# Patient Record
Sex: Male | Born: 1990 | Race: Black or African American | Hispanic: No | Marital: Single | Smoking: Never smoker
Health system: Southern US, Community
[De-identification: ages and names within clinical notes are randomized; demographics above are authoritative.]

## PROBLEM LIST (undated history)

## (undated) DIAGNOSIS — I10 Essential (primary) hypertension: Secondary | ICD-10-CM

---

## 2016-12-09 ENCOUNTER — Encounter (HOSPITAL_COMMUNITY): Payer: Self-pay

## 2016-12-09 ENCOUNTER — Emergency Department (HOSPITAL_COMMUNITY): Payer: Self-pay

## 2016-12-09 ENCOUNTER — Emergency Department (HOSPITAL_COMMUNITY)
Admission: EM | Admit: 2016-12-09 | Discharge: 2016-12-09 | Disposition: A | Discharge status: Home / Self Care | Payer: Self-pay | Attending: Emergency Medicine | Admitting: Emergency Medicine

## 2016-12-09 DIAGNOSIS — R61 Generalized hyperhidrosis: Secondary | ICD-10-CM | POA: Insufficient documentation

## 2016-12-09 DIAGNOSIS — R112 Nausea with vomiting, unspecified: Secondary | ICD-10-CM | POA: Insufficient documentation

## 2016-12-09 DIAGNOSIS — R51 Headache: Secondary | ICD-10-CM | POA: Insufficient documentation

## 2016-12-09 DIAGNOSIS — R519 Headache, unspecified: Secondary | ICD-10-CM

## 2016-12-09 HISTORY — DX: Essential (primary) hypertension: I10

## 2016-12-09 LAB — CBC WITH DIFFERENTIAL/PLATELET
Basophils Absolute: 0 10*3/uL (ref 0.0–0.1)
Basophils Relative: 0 %
EOS ABS: 0.4 10*3/uL (ref 0.0–0.7)
Eosinophils Relative: 3 %
HEMATOCRIT: 42.2 % (ref 39.0–52.0)
HEMOGLOBIN: 13.8 g/dL (ref 13.0–17.0)
LYMPHS ABS: 4.9 10*3/uL — AB (ref 0.7–4.0)
Lymphocytes Relative: 32 %
MCH: 27.1 pg (ref 26.0–34.0)
MCHC: 32.7 g/dL (ref 30.0–36.0)
MCV: 82.7 fL (ref 78.0–100.0)
MONOS PCT: 7 %
Monocytes Absolute: 1 10*3/uL (ref 0.1–1.0)
NEUTROS ABS: 9.3 10*3/uL — AB (ref 1.7–7.7)
NEUTROS PCT: 58 %
Platelets: 313 10*3/uL (ref 150–400)
RBC: 5.1 MIL/uL (ref 4.22–5.81)
RDW: 15.2 % (ref 11.5–15.5)
WBC: 15.7 10*3/uL — AB (ref 4.0–10.5)

## 2016-12-09 LAB — BASIC METABOLIC PANEL
ANION GAP: 8 (ref 5–15)
BUN: 6 mg/dL (ref 6–20)
CALCIUM: 9 mg/dL (ref 8.9–10.3)
CO2: 29 mmol/L (ref 22–32)
Chloride: 104 mmol/L (ref 101–111)
Creatinine, Ser: 0.65 mg/dL (ref 0.61–1.24)
GLUCOSE: 104 mg/dL — AB (ref 65–99)
POTASSIUM: 3.7 mmol/L (ref 3.5–5.1)
Sodium: 141 mmol/L (ref 135–145)

## 2016-12-09 LAB — I-STAT TROPONIN, ED: TROPONIN I, POC: 0 ng/mL (ref 0.00–0.08)

## 2016-12-09 MED ORDER — METOCLOPRAMIDE HCL 5 MG/ML IJ SOLN
10.0000 mg | Freq: Once | INTRAMUSCULAR | Status: AC
  Administered 2016-12-09: 10 mg via INTRAVENOUS
  Filled 2016-12-09: qty 2

## 2016-12-09 MED ORDER — ACETAMINOPHEN 325 MG PO TABS
650.0000 mg | ORAL_TABLET | Freq: Once | ORAL | Status: AC
  Administered 2016-12-09: 650 mg via ORAL
  Filled 2016-12-09: qty 2

## 2016-12-09 MED ORDER — DIPHENHYDRAMINE HCL 50 MG/ML IJ SOLN
25.0000 mg | Freq: Once | INTRAMUSCULAR | Status: AC
  Administered 2016-12-09: 25 mg via INTRAVENOUS
  Filled 2016-12-09: qty 1

## 2016-12-09 MED ORDER — AMLODIPINE BESYLATE 5 MG PO TABS: 5.0000 mg | ORAL_TABLET | Freq: Every day | ORAL | 0 refills | Status: AC

## 2016-12-09 MED ORDER — METOCLOPRAMIDE HCL 10 MG PO TABS: 10.0000 mg | ORAL_TABLET | Freq: Four times a day (QID) | ORAL | 0 refills | Status: DC | PRN

## 2016-12-09 NOTE — ED Notes (Signed)
Pt also complains of a headache

## 2016-12-09 NOTE — ED Triage Notes (Signed)
Pt complains of hypertension and severe sweating Pt took a norvasc yesterday, his old medicine

## 2016-12-09 NOTE — ED Provider Notes (Addendum)
[facility]-EMERGENCY DEPT Provider Note   CSN: [id number] Arrival date & time: 12/09/16  0654     History   Chief Complaint Chief Complaint  Patient presents with  . Hypertension  . Excessive Sweating    HPI Ronald Marks is a 26 y.o. male.  HPI Complains of throbbing bitemporal headache which started yesterday, gradual onset accompanied by vomiting 5 times this morning. Chills. He denies any neck pain. Denies sore throat denies neck stiffness. No other associated symptoms. Treated himself with Tylenol last dose 5 AM today. He reports getting headaches approximately every other day for the past 6 months. Nothing makes symptoms better or worse. No chest pain. No fever. Also treated himself with Norvasc this morning. Past Medical History:  Diagnosis Date  . Hypertension     There are no active problems to display for this patient.   History reviewed. No pertinent surgical history.     Home Medications    Prior to Admission medications   Not on File    Family History History reviewed. No pertinent family history.  Social History Social History  Substance Use Topics  . Smoking status: Never Smoker  . Smokeless tobacco: Never Used  . Alcohol use No   Positive marijuana use no tobacco no alcohol no other illicit drug use  Allergies   Patient has no allergy information on record.   Review of Systems Review of Systems  Constitutional: Positive for diaphoresis.  HENT: Negative.   Respiratory: Negative.   Cardiovascular: Positive for chest pain.       Syncope  Gastrointestinal: Positive for nausea and vomiting.  Musculoskeletal: Negative.   Skin: Negative.   Allergic/Immunologic: Negative.   Neurological: Positive for headaches.  Psychiatric/Behavioral: Negative.   All other systems reviewed and are negative.    Physical Exam Updated Vital Signs BP (!) 145/96 (BP Location: Left Arm)   Pulse 77   Temp 97.9 F (36.6 C) (Oral)   SpO2 99%   Physical Exam   Constitutional: He is oriented to person, place, and time. He appears well-developed and well-nourished.  HENT:  Head: Normocephalic and atraumatic.  Eyes: Conjunctivae are normal. Pupils are equal, round, and reactive to light.  Optic discs sharp  Neck: Neck supple. No tracheal deviation present. No thyromegaly present.  No signs of meningitis  Cardiovascular: Normal rate and regular rhythm.   No murmur heard. Pulmonary/Chest: Effort normal and breath sounds normal.  Abdominal: Soft. Bowel sounds are normal. He exhibits no distension. There is no tenderness.  Obese  Musculoskeletal: Normal range of motion. He exhibits no edema or tenderness.  Lymphadenopathy:    He has no cervical adenopathy.  Neurological: He is alert and oriented to person, place, and time. Coordination normal.  Gait normal motor strength 5 over 5 overall pronator drift normal. Not lightheaded on standing  Skin: Skin is warm. No rash noted. He is diaphoretic.  Diaphoretic  Psychiatric: He has a normal mood and affect. Thought content normal.  Nursing note and vitals reviewed.    ED Treatments / Results  Labs (all labs ordered are listed, but only abnormal results are displayed) Labs Reviewed - No data to display  EKG  EKG Interpretation None       Radiology No results found.  Procedures Procedures (including critical care time)  Medications Ordered in ED Medications - No data to display   Initial Impression / Assessment and Plan / ED Course  I have reviewed the triage vital signs and the nursing notes.  Pertinent  labs & imaging results that were available during my care of the patient were reviewed by me and considered in my medical decision making (see chart for details).     8:30 AM feels improved after treatment with intravenous Reglan and Benadryl. He is resting comfortably. Alert Glasgow Coma Score 15. Skin no longer diaphoretic and now warm and dry ED ECG REPORT   Date: 12/09/2016   Rate:   Rhythm: normal sinus rhythm  QRS Axis: normal  Intervals: normal  ST/T Wave abnormalities: nonspecific T wave changes  Conduction Disutrbances:Rightward IV CD  Narrative Interpretation:   Old EKG Reviewed: none available  Results for orders placed or performed during the hospital encounter of 12/09/16  CBC with Differential/Platelet  Result Value Ref Range   WBC 15.7 (H) 4.0 - 10.5 K/uL   RBC 5.10 4.22 - 5.81 MIL/uL   Hemoglobin 13.8 13.0 - 17.0 g/dL   HCT 40.9 81.1 - 91.4 %   MCV 82.7 78.0 - 100.0 fL   MCH 27.1 26.0 - 34.0 pg   MCHC 32.7 30.0 - 36.0 g/dL   RDW 78.2 95.6 - 21.3 %   Platelets 313 150 - 400 K/uL   Neutrophils Relative % 58 %   Neutro Abs 9.3 (H) 1.7 - 7.7 K/uL   Lymphocytes Relative 32 %   Lymphs Abs 4.9 (H) 0.7 - 4.0 K/uL   Monocytes Relative 7 %   Monocytes Absolute 1.0 0.1 - 1.0 K/uL   Eosinophils Relative 3 %   Eosinophils Absolute 0.4 0.0 - 0.7 K/uL   Basophils Relative 0 %   Basophils Absolute 0.0 0.0 - 0.1 K/uL  Basic metabolic panel  Result Value Ref Range   Sodium 141 135 - 145 mmol/L   Potassium 3.7 3.5 - 5.1 mmol/L   Chloride 104 101 - 111 mmol/L   CO2 29 22 - 32 mmol/L   Glucose, Bld 104 (H) 65 - 99 mg/dL   BUN 6 6 - 20 mg/dL   Creatinine, Ser 0.86 0.61 - 1.24 mg/dL   Calcium 9.0 8.9 - 57.8 mg/dL   GFR calc non Af Amer >60 >60 mL/min   GFR calc Af Amer >60 >60 mL/min   Anion gap 8 5 - 15  I-stat troponin, ED  Result Value Ref Range   Troponin i, poc 0.00 0.00 - 0.08 ng/mL   Comment 3           Ct Head Wo Contrast  Result Date: 12/09/2016 CLINICAL DATA:  Severe headaches for 2 days EXAM: CT HEAD WITHOUT CONTRAST TECHNIQUE: Contiguous axial images were obtained from the base of the skull through the vertex without intravenous contrast. COMPARISON:  None. FINDINGS: Brain: No evidence of acute infarction, hemorrhage, hydrocephalus, extra-axial collection or mass lesion/mass effect. Vascular: No hyperdense vessel or unexpected  calcification. Skull: No osseous abnormality. Sinuses/Orbits: Visualized paranasal sinuses are clear. Visualized mastoid sinuses are clear. Visualized orbits demonstrate no focal abnormality. Other: None IMPRESSION: No acute intracranial pathology. Electronically Signed   By: Elige Ko   On: 12/09/2016 08:17   Results for orders placed or performed during the hospital encounter of 12/09/16  CBC with Differential/Platelet  Result Value Ref Range   WBC 15.7 (H) 4.0 - 10.5 K/uL   RBC 5.10 4.22 - 5.81 MIL/uL   Hemoglobin 13.8 13.0 - 17.0 g/dL   HCT 46.9 62.9 - 52.8 %   MCV 82.7 78.0 - 100.0 fL   MCH 27.1 26.0 - 34.0 pg   MCHC 32.7 30.0 -  36.0 g/dL   RDW 16.1 09.6 - 04.5 %   Platelets 313 150 - 400 K/uL   Neutrophils Relative % 58 %   Neutro Abs 9.3 (H) 1.7 - 7.7 K/uL   Lymphocytes Relative 32 %   Lymphs Abs 4.9 (H) 0.7 - 4.0 K/uL   Monocytes Relative 7 %   Monocytes Absolute 1.0 0.1 - 1.0 K/uL   Eosinophils Relative 3 %   Eosinophils Absolute 0.4 0.0 - 0.7 K/uL   Basophils Relative 0 %   Basophils Absolute 0.0 0.0 - 0.1 K/uL  Basic metabolic panel  Result Value Ref Range   Sodium 141 135 - 145 mmol/L   Potassium 3.7 3.5 - 5.1 mmol/L   Chloride 104 101 - 111 mmol/L   CO2 29 22 - 32 mmol/L   Glucose, Bld 104 (H) 65 - 99 mg/dL   BUN 6 6 - 20 mg/dL   Creatinine, Ser 4.09 0.61 - 1.24 mg/dL   Calcium 9.0 8.9 - 81.1 mg/dL   GFR calc non Af Amer >60 >60 mL/min   GFR calc Af Amer >60 >60 mL/min   Anion gap 8 5 - 15  I-stat troponin, ED  Result Value Ref Range   Troponin i, poc 0.00 0.00 - 0.08 ng/mL   Comment 3           Ct Head Wo Contrast  Result Date: 12/09/2016 CLINICAL DATA:  Severe headaches for 2 days EXAM: CT HEAD WITHOUT CONTRAST TECHNIQUE: Contiguous axial images were obtained from the base of the skull through the vertex without intravenous contrast. COMPARISON:  None. FINDINGS: Brain: No evidence of acute infarction, hemorrhage, hydrocephalus, extra-axial collection or  mass lesion/mass effect. Vascular: No hyperdense vessel or unexpected calcification. Skull: No osseous abnormality. Sinuses/Orbits: Visualized paranasal sinuses are clear. Visualized mastoid sinuses are clear. Visualized orbits demonstrate no focal abnormality. Other: None IMPRESSION: No acute intracranial pathology. Electronically Signed   By: Elige Ko   On: 12/09/2016 08:17    10 AM headache is much improved, almost gone. He is requesting Tylenol for mild left-sided temporal headache presently. Nausea has resolved. He is alert and ambulatory without difficulty. Skin warm and dry without rash. He feels ready to go home.  Strongly doubt meningitis. No fever. No neck pain no neck stiffness. He gets chronic headaches. Strongly doubt anginal: This young male. Plan prescriptions Norvasc, Reglan as needed for nausea and headache. Blood pressure recheck 3 weeks. I have personally reviewed the EKG tracing and agree with the computerized printout as noted. Patient has chronic headaches Final Clinical Impressions(s) / ED Diagnoses  Diagnosis #1 bitemporal headache #2 nausea and vomiting 3 elevated blood pressure Final diagnoses:  None    New Prescriptions New Prescriptions   No medications on file     Doug Sou, MD 12/09/16 1010    Doug Sou, MD 12/09/16 1536

## 2016-12-09 NOTE — Discharge Instructions (Signed)
Take the medication prescribed as needed for nausea. Take your blood pressure medication(amlodipine or Norvasc) daily as directed. Call any of the numbers on these instructions to get a primary care physician. Your blood pressure should be rechecked within the next 3 weeks

## 2017-08-13 ENCOUNTER — Encounter (HOSPITAL_COMMUNITY): Payer: Self-pay

## 2017-08-13 ENCOUNTER — Other Ambulatory Visit: Payer: Self-pay

## 2017-08-13 ENCOUNTER — Emergency Department (HOSPITAL_COMMUNITY)
Admission: EM | Admit: 2017-08-13 | Discharge: 2017-08-13 | Disposition: A | Discharge status: Home / Self Care | Payer: Self-pay | Attending: Emergency Medicine | Admitting: Emergency Medicine

## 2017-08-13 DIAGNOSIS — Z79899 Other long term (current) drug therapy: Secondary | ICD-10-CM | POA: Insufficient documentation

## 2017-08-13 DIAGNOSIS — I10 Essential (primary) hypertension: Secondary | ICD-10-CM | POA: Insufficient documentation

## 2017-08-13 DIAGNOSIS — R55 Syncope and collapse: Secondary | ICD-10-CM | POA: Insufficient documentation

## 2017-08-13 NOTE — ED Provider Notes (Signed)
[facility] EMERGENCY DEPARTMENT Provider Note   CSN: [id number] Arrival date & time: 08/13/17  1210     History   Chief Complaint Chief Complaint  Patient presents with  . Loss of Consciousness  . URI    HPI Ronald Marks is a 27 y.o. male.  The history is provided by the patient and medical records. No language interpreter was used.  URI   Associated symptoms include diarrhea, nausea and congestion. Pertinent negatives include no abdominal pain, no vomiting and no headaches.   Ronald Marks is a 27 y.o. male  with a PMH of HTN who presents to the Emergency Department complaining of near-syncopal episode just prior to arrival. Patient states that he has been sick for the last 3-4 days.  Endorses subjective fever and chills, congestion, nausea and diarrhea.  Children at home with similar symptoms and have been on antibiotics.  He reports that he felt much better today and has had no episodes of nausea or diarrhea.  He went to the grocery store and started walking around when he began to feel lightheaded, therefore sat down.  Attendant at the store brought him a bottle of water and he felt better.  He never lost consciousness.  EMS was called and transported him to the emergency department.  He did receive about 300 cc of IV hydration in route.  He now reports that he feels much better and has no complaints.  He never had any chest pain or difficulty breathing.  He did not hit his head.  He denies any history of similar the past.  No seizure history.   Past Medical History:  Diagnosis Date  . Hypertension     There are no active problems to display for this patient.   History reviewed. No pertinent surgical history.     Home Medications    Prior to Admission medications   Medication Sig Start Date End Date Taking? Authorizing Provider  acetaminophen (TYLENOL) 500 MG tablet Take 1,000 mg by mouth every 6 (six) hours as needed (Pain).    [provider]  amLODipine (NORVASC) 5 MG tablet Take 1 tablet (5 mg total) by mouth daily. 12/09/16   Doug Sou, MD  metoCLOPramide (REGLAN) 10 MG tablet Take 1 tablet (10 mg total) by mouth every 6 (six) hours as needed for nausea (nausea/headache). 12/09/16   Doug Sou, MD    Family History No family history on file.  Social History Social History   Tobacco Use  . Smoking status: Never Smoker  . Smokeless tobacco: Never Used  Substance Use Topics  . Alcohol use: No  . Drug use: No     Allergies   Lactose intolerance (gi)   Review of Systems Review of Systems  Constitutional: Positive for chills and fever (Subjective).  HENT: Positive for congestion.   Respiratory: Negative for shortness of breath.   Gastrointestinal: Positive for diarrhea and nausea. Negative for abdominal pain, blood in stool and vomiting.  Neurological: Positive for light-headedness. Negative for dizziness, weakness, numbness and headaches.       + Near syncopal episode.     Physical Exam Updated Vital Signs BP (!) 110/53 (BP Location: Left Arm)   Pulse 74   Temp 99.3 F (37.4 C) (Oral)   Ht 5\' 9"  (1.753 m)   Wt 136.1 kg (300 lb)   SpO2 99%   BMI 44.30 kg/m   Physical Exam  Constitutional: He is oriented to person, place, and time. He appears  well-developed and well-nourished. No distress.  HENT:  Head: Normocephalic and atraumatic.  Neck: Neck supple.  Cardiovascular: Normal rate, regular rhythm and normal heart sounds.  No murmur heard. Pulmonary/Chest: Effort normal and breath sounds normal. No respiratory distress.  Lungs clear to auscultation bilaterally.  Abdominal: Soft. Bowel sounds are normal. He exhibits no distension.  No abdominal tenderness.  Neurological: He is alert and oriented to person, place, and time.  Speech clear and goal oriented. CN 2-12 grossly intact. Normal finger-to-nose and rapid alternating movements. No drift. Strength and sensation intact. Steady gait.    Skin: Skin is warm and dry.  Cap refill < 2 seconds.   Nursing note and vitals reviewed.    ED Treatments / Results  Labs (all labs ordered are listed, but only abnormal results are displayed) Labs Reviewed - No data to display  EKG  EKG Interpretation  Date/Time:  Saturday August 13 2017 13:12:12 EST Ventricular Rate:  68 PR Interval:  176 QRS Duration: 104 QT Interval:  378 QTC Calculation: 401 R Axis:   79 Text Interpretation:  Normal sinus rhythm Incomplete right bundle branch block Possible Inferior infarct , age undetermined Cannot rule out Anterior infarct , age undetermined Abnormal ECG No STEMI. Similar to prior.  Confirmed by Alona BeneLong, Joshua [phone]([id number]) on 08/13/2017 1:29:32 PM       Radiology No results found.  Procedures Procedures (including critical care time)  Medications Ordered in ED Medications - No data to display   Initial Impression / Assessment and Plan / ED Course  I have reviewed the triage vital signs and the nursing notes.  Pertinent labs & imaging results that were available during my care of the patient were reviewed by me and considered in my medical decision making (see chart for details).    Tressia DanasSteve Ruark is a 27 y.o. male who presents to ED for congestion, subjective fever, chills, nausea and diarrhea x 3-4 days. Symptoms improving with no nausea or diarrhea today. He went to [employer] where he had near syncopal episode. Symptoms improved while sitting and drinking a bottle of water. Upon ER evaluation, and is afebrile, hemodynamically stable.  Orthostatics reassuring.  Normal cardiopulmonary exam.  No abdominal tenderness.  Appears well and is without complaints. Ambulatory in ED without difficulty. EKG reassuring. Evaluation does not show pathology that would require ongoing emergent intervention or inpatient treatment. Reasons to return to ER discussed. PCP follow up encouraged. All questions answered.   Patient discussed with Dr. Jacqulyn BathLong who  agrees with treatment plan.   Final Clinical Impressions(s) / ED Diagnoses   Final diagnoses:  Near syncope    ED Discharge Orders    None       Shamiyah Ngu, Chase PicketJaime Pilcher, [place]-C 08/13/17 1413    Maia PlanLong, Joshua G, MD 08/13/17 2010

## 2017-08-13 NOTE — ED Triage Notes (Signed)
Pt arrived via [facility] from home with flu symptoms X3 days. Pt reports that his children have been on antibiotics and he has been taking some of their medications. [place] was in [employer] [place] and had diaphoretic and syncopal episode. Lowest BP 100 Palp. For EMS, CBG 125. 18 L AC. Pt received 300 NS.

## 2017-08-13 NOTE — ED Notes (Signed)
ED Provider at bedside. 

## 2017-08-13 NOTE — Discharge Instructions (Signed)
It was my pleasure taking care of you today!   Increase hydration.   Follow up with your primary care doctor. If you do not have one, please see the information below.   Return to ER for new or worsening symptoms, any additional concerns.   To find a primary care or specialty doctor please call [phone] or [phone] to access "[facility] Find a Doctor Service."  You may also go on the [facility] website at [email or web address]  There are also multiple [facility] practices throughout the [place] that are frequently accepting new patients. You may find a clinic that is close to your home and contact them.  [facility] [address] [phone]  [facility] in [place] (also locations in [place] and [place]) - [address] [place] [phone]  [facility] - [address] [place] [phone]

## 2017-08-15 ENCOUNTER — Encounter (HOSPITAL_COMMUNITY): Payer: Self-pay | Admitting: Emergency Medicine

## 2017-08-15 ENCOUNTER — Emergency Department (HOSPITAL_COMMUNITY): Payer: Self-pay

## 2017-08-15 ENCOUNTER — Other Ambulatory Visit: Payer: Self-pay

## 2017-08-15 DIAGNOSIS — I10 Essential (primary) hypertension: Secondary | ICD-10-CM | POA: Insufficient documentation

## 2017-08-15 DIAGNOSIS — J1189 Influenza due to unidentified influenza virus with other manifestations: Secondary | ICD-10-CM | POA: Insufficient documentation

## 2017-08-15 DIAGNOSIS — Z79899 Other long term (current) drug therapy: Secondary | ICD-10-CM | POA: Insufficient documentation

## 2017-08-15 DIAGNOSIS — E876 Hypokalemia: Secondary | ICD-10-CM | POA: Insufficient documentation

## 2017-08-15 LAB — COMPREHENSIVE METABOLIC PANEL
ALK PHOS: 60 U/L (ref 38–126)
ALT: 15 U/L — AB (ref 17–63)
AST: 31 U/L (ref 15–41)
Albumin: 3.7 g/dL (ref 3.5–5.0)
Anion gap: 14 (ref 5–15)
BUN: 6 mg/dL (ref 6–20)
CALCIUM: 8.8 mg/dL — AB (ref 8.9–10.3)
CHLORIDE: 103 mmol/L (ref 101–111)
CO2: 19 mmol/L — AB (ref 22–32)
CREATININE: 0.83 mg/dL (ref 0.61–1.24)
GFR calc Af Amer: >60 mL/min (ref >60)
Glucose, Bld: 93 mg/dL (ref 65–99)
Potassium: 3 mmol/L — ABNORMAL LOW (ref 3.5–5.1)
Sodium: 136 mmol/L (ref 135–145)
Total Bilirubin: 0.7 mg/dL (ref 0.3–1.2)
Total Protein: 7.3 g/dL (ref 6.5–8.1)

## 2017-08-15 LAB — CBC WITH DIFFERENTIAL/PLATELET
BASOS PCT: 2 %
Basophils Absolute: 0.1 10*3/uL (ref 0.0–0.1)
EOS PCT: 0 %
Eosinophils Absolute: 0 10*3/uL (ref 0.0–0.7)
HCT: 44.2 % (ref 39.0–52.0)
HEMOGLOBIN: 14.8 g/dL (ref 13.0–17.0)
LYMPHS PCT: 38 %
Lymphs Abs: 2.7 10*3/uL (ref 0.7–4.0)
MCH: 26.8 pg (ref 26.0–34.0)
MCHC: 33.5 g/dL (ref 30.0–36.0)
MCV: 79.9 fL (ref 78.0–100.0)
MONO ABS: 0.8 10*3/uL (ref 0.1–1.0)
MONOS PCT: 11 %
NEUTROS PCT: 49 %
Neutro Abs: 3.6 10*3/uL (ref 1.7–7.7)
Platelets: 192 10*3/uL (ref 150–400)
RBC: 5.53 MIL/uL (ref 4.22–5.81)
RDW: 13.6 % (ref 11.5–15.5)
WBC: 7.2 10*3/uL (ref 4.0–10.5)

## 2017-08-15 NOTE — ED Triage Notes (Signed)
Pt st's he has been having flu type symptoms x's 3 days.  Pt st's he had vomiting and diarrhea, coughing and feeling weak.  Pt also c/o generalized chest pain and elevated temp.

## 2017-08-15 NOTE — ED Notes (Signed)
Returned from xray

## 2017-08-16 ENCOUNTER — Emergency Department (HOSPITAL_COMMUNITY)
Admission: EM | Admit: 2017-08-16 | Discharge: 2017-08-16 | Disposition: A | Discharge status: Home / Self Care | Payer: Self-pay | Attending: Emergency Medicine | Admitting: Emergency Medicine

## 2017-08-16 DIAGNOSIS — E876 Hypokalemia: Secondary | ICD-10-CM

## 2017-08-16 DIAGNOSIS — R509 Fever, unspecified: Secondary | ICD-10-CM

## 2017-08-16 DIAGNOSIS — R6889 Other general symptoms and signs: Secondary | ICD-10-CM

## 2017-08-16 LAB — URINALYSIS, ROUTINE W REFLEX MICROSCOPIC
Bilirubin Urine: NEGATIVE
GLUCOSE, UA: NEGATIVE mg/dL
Hgb urine dipstick: NEGATIVE
Ketones, ur: 20 mg/dL — AB
Leukocytes, UA: NEGATIVE
NITRITE: NEGATIVE
PH: 5 (ref 5.0–8.0)
Protein, ur: 30 mg/dL — AB
SPECIFIC GRAVITY, URINE: 1.025 (ref 1.005–1.030)

## 2017-08-16 LAB — INFLUENZA PANEL BY PCR (TYPE A & B)
Influenza A By PCR: POSITIVE — AB
Influenza B By PCR: NEGATIVE

## 2017-08-16 MED ORDER — FLUTICASONE PROPIONATE 50 MCG/ACT NA SUSP: 2.0000 | Freq: Every day | NASAL | 2 refills | Status: DC

## 2017-08-16 MED ORDER — ONDANSETRON HCL 4 MG/2ML IJ SOLN
4.0000 mg | Freq: Once | INTRAMUSCULAR | Status: AC
  Administered 2017-08-16: 4 mg via INTRAVENOUS
  Filled 2017-08-16: qty 2

## 2017-08-16 MED ORDER — AEROCHAMBER PLUS FLO-VU LARGE MISC
1.0000 | Freq: Once | Status: AC
  Administered 2017-08-16: 1

## 2017-08-16 MED ORDER — ALBUTEROL SULFATE HFA 108 (90 BASE) MCG/ACT IN AERS
2.0000 | INHALATION_SPRAY | RESPIRATORY_TRACT | Status: DC | PRN
  Administered 2017-08-16: 2 via RESPIRATORY_TRACT
  Filled 2017-08-16: qty 6.7

## 2017-08-16 MED ORDER — FLUTICASONE PROPIONATE 50 MCG/ACT NA SUSP: 2.0000 | Freq: Every day | NASAL | 2 refills | Status: AC

## 2017-08-16 MED ORDER — POTASSIUM CHLORIDE CRYS ER 20 MEQ PO TBCR
40.0000 meq | EXTENDED_RELEASE_TABLET | Freq: Once | ORAL | Status: AC
  Administered 2017-08-16: 40 meq via ORAL
  Filled 2017-08-16: qty 2

## 2017-08-16 MED ORDER — BENZONATATE 100 MG PO CAPS: 100.0000 mg | ORAL_CAPSULE | Freq: Three times a day (TID) | ORAL | 0 refills | Status: DC

## 2017-08-16 MED ORDER — IBUPROFEN 800 MG PO TABS
800.0000 mg | ORAL_TABLET | Freq: Once | ORAL | Status: AC
  Administered 2017-08-16: 800 mg via ORAL
  Filled 2017-08-16: qty 1

## 2017-08-16 MED ORDER — SODIUM CHLORIDE 0.9 % IV BOLUS (SEPSIS)
1000.0000 mL | Freq: Once | INTRAVENOUS | Status: AC
  Administered 2017-08-16: 1000 mL via INTRAVENOUS

## 2017-08-16 MED ORDER — POTASSIUM CHLORIDE CRYS ER 20 MEQ PO TBCR: 20.0000 meq | EXTENDED_RELEASE_TABLET | Freq: Every day | ORAL | 0 refills | Status: DC

## 2017-08-16 NOTE — ED Provider Notes (Signed)
[facility]   CSN: [id number] Arrival date & time: 08/15/17  2137     History   Chief Complaint Chief Complaint  Patient presents with  . Influenza    HPI Ronald Marks is a 27 y.o. male with a hx of HTN Presents to the Emergency Department complaining of gradual, persistent, progressively worsening URI symptoms onset 4 days ago.  Patient reports he has had fever, chills, generalized headache, rhinorrhea, sore throat, cough and congestion.  He reports his children have been sick with similar symptoms.  He did not receive a flu vaccine this year.  No treatments prior to arrival.  No aggravating or alleviating factors.  Patient reports he is a current smoker.  He denies a history of COPD or asthma.  The history is provided by the patient and medical records. No language interpreter was used.    Past Medical History:  Diagnosis Date  . Hypertension     There are no active problems to display for this patient.   History reviewed. No pertinent surgical history.     Home Medications    Prior to Admission medications   Medication Sig Start Date End Date Taking? Authorizing Provider  acetaminophen (TYLENOL) 500 MG tablet Take 1,000 mg by mouth every 6 (six) hours as needed (Pain).   Yes [provider]  amLODipine (NORVASC) 5 MG tablet Take 1 tablet (5 mg total) by mouth daily. Patient not taking: Reported on 08/16/2017 12/09/16   Doug SouJacubowitz, Sam, MD  benzonatate (TESSALON) 100 MG capsule Take 1 capsule (100 mg total) by mouth every 8 (eight) hours. 08/16/17   Donnamaria Shands, Dahlia ClientHannah, PA-C  fluticasone (FLONASE) 50 MCG/ACT nasal spray Place 2 sprays into both nostrils daily. 08/16/17   Ramin Zoll, Dahlia ClientHannah, PA-C  metoCLOPramide (REGLAN) 10 MG tablet Take 1 tablet (10 mg total) by mouth every 6 (six) hours as needed for nausea (nausea/headache). Patient not taking: Reported on 08/16/2017 12/09/16   Doug SouJacubowitz, Sam, MD  potassium  chloride SA (K-DUR,KLOR-CON) 20 MEQ tablet Take 1 tablet (20 mEq total) by mouth daily. 08/16/17   Shamar Kracke, Dahlia ClientHannah, PA-C    Family History No family history on file.  Social History Social History   Tobacco Use  . Smoking status: Never Smoker  . Smokeless tobacco: Never Used  Substance Use Topics  . Alcohol use: No  . Drug use: No     Allergies   Lactose intolerance (gi)   Review of Systems Review of Systems  Constitutional: Positive for chills, fatigue and fever. Negative for appetite change.  HENT: Positive for congestion, postnasal drip, rhinorrhea, sinus pressure and sore throat. Negative for ear discharge, ear pain and mouth sores.   Eyes: Negative for visual disturbance.  Respiratory: Positive for cough. Negative for chest tightness, shortness of breath, wheezing and stridor.   Cardiovascular: Negative for chest pain, palpitations and leg swelling.  Gastrointestinal: Negative for abdominal pain, diarrhea, nausea and vomiting.  Genitourinary: Negative for dysuria, frequency, hematuria and urgency.  Musculoskeletal: Negative for arthralgias, back pain, myalgias and neck stiffness.  Skin: Negative for rash.  Neurological: Positive for headaches. Negative for syncope, light-headedness and numbness.  Hematological: Negative for adenopathy.  Psychiatric/Behavioral: The patient is not nervous/anxious.   All other systems reviewed and are negative.    Physical Exam Updated Vital Signs BP (!) 118/57 (BP Location: Left Arm)   Pulse 74   Temp 99.8 F (37.7 C) (Oral)   Resp (!) 21   Ht 5\' 9"  (1.753 m)  Wt 136.1 kg (300 lb)   SpO2 100%   BMI 44.30 kg/m   Physical Exam  Constitutional: He appears well-developed and well-nourished. No distress.  HENT:  Head: Normocephalic and atraumatic.  Right Ear: Tympanic membrane, external ear and ear canal normal.  Left Ear: Tympanic membrane, external ear and ear canal normal.  Nose: Mucosal edema and rhinorrhea present.  No epistaxis. Right sinus exhibits no maxillary sinus tenderness and no frontal sinus tenderness. Left sinus exhibits no maxillary sinus tenderness and no frontal sinus tenderness.  Mouth/Throat: Uvula is midline and mucous membranes are normal. Mucous membranes are not pale and not cyanotic. No oropharyngeal exudate, posterior oropharyngeal edema, posterior oropharyngeal erythema or tonsillar abscesses.  Eyes: Conjunctivae are normal. Pupils are equal, round, and reactive to light.  Neck: Normal range of motion and full passive range of motion without pain.  Cardiovascular: Normal rate and intact distal pulses.  Pulmonary/Chest: Effort normal. No stridor. He has decreased breath sounds.  Clear and equal, but diminished breath sounds without focal wheezes, rhonchi, rales Congested cough  Abdominal: Soft. There is no tenderness.  Musculoskeletal: Normal range of motion.  Lymphadenopathy:    He has no cervical adenopathy.  Neurological: He is alert.  Skin: Skin is warm and dry. No rash noted. He is not diaphoretic.  Psychiatric: He has a normal mood and affect.  Nursing note and vitals reviewed.    ED Treatments / Results  Labs (all labs ordered are listed, but only abnormal results are displayed) Labs Reviewed  COMPREHENSIVE METABOLIC PANEL - Abnormal; Notable for the following components:      Result Value   Potassium 3.0 (*)    CO2 19 (*)    Calcium 8.8 (*)    ALT 15 (*)    All other components within normal limits  URINALYSIS, ROUTINE W REFLEX MICROSCOPIC - Abnormal; Notable for the following components:   Color, Urine AMBER (*)    Ketones, ur 20 (*)    Protein, ur 30 (*)    Bacteria, UA RARE (*)    Squamous Epithelial / LPF 0-5 (*)    All other components within normal limits  INFLUENZA PANEL BY PCR (TYPE A & B) - Abnormal; Notable for the following components:   Influenza A By PCR POSITIVE (*)    All other components within normal limits  CBC WITH DIFFERENTIAL/PLATELET     EKG  EKG Interpretation  Date/Time:  Monday August 15 2017 21:48:23 EST Ventricular Rate:  102 PR Interval:  152 QRS Duration: 100 QT Interval:  346 QTC Calculation: 450 R Axis:   86 Text Interpretation:  Sinus tachycardia Right atrial enlargement T wave abnormality, consider inferior ischemia Abnormal ECG Q wave in lead III of uncertain significance When compared with ECG of 08/13/2017, No significant change was found Confirmed by Dione Booze ([id number]) on 08/15/2017 11:54:37 PM       Radiology Dg Chest 2 View  Result Date: 08/15/2017 CLINICAL DATA:  Acute onset of vomiting and diarrhea. Cough. Generalized weakness. Fever. EXAM: CHEST  2 VIEW COMPARISON:  None. FINDINGS: The lungs are well-aerated. Mild peribronchial thickening is noted. There is no evidence of focal opacification, pleural effusion or pneumothorax. The heart is normal in size; the mediastinal contour is within normal limits. No acute osseous abnormalities are seen. IMPRESSION: Mild peribronchial thickening noted.  Lungs otherwise grossly clear. Electronically Signed   By: Roanna Raider M.D.   On: 08/15/2017 22:53    Procedures Procedures (including critical care time)  Medications  Ordered in ED Medications  albuterol (PROVENTIL HFA;VENTOLIN HFA) 108 (90 Base) MCG/ACT inhaler 2 puff (2 puffs Inhalation Given 08/16/17 0317)  sodium chloride 0.9 % bolus 1,000 mL (0 mLs Intravenous Stopped 08/16/17 0318)  ibuprofen (ADVIL,MOTRIN) tablet 800 mg (800 mg Oral Given 08/16/17 0317)  potassium chloride SA (K-DUR,KLOR-CON) CR tablet 40 mEq (40 mEq Oral Given 08/16/17 0316)  ondansetron (ZOFRAN) injection 4 mg (4 mg Intravenous Given 08/16/17 0203)  AEROCHAMBER PLUS FLO-VU LARGE MISC 1 each (1 each Other Given 08/16/17 0317)     Initial Impression / Assessment and Plan / ED Course  I have reviewed the triage vital signs and the nursing notes.  Pertinent labs & imaging results that were available during my care of the patient  were reviewed by me and considered in my medical decision making (see chart for details).     Patient with symptoms consistent with influenza.  Vitals are stable, low-grade fever (100.7).  No signs of dehydration, tolerating PO's.  Lungs are clear.  Patient is a current smoker, given albuterol here in the emergency department.  Chest x-ray without evidence of pneumonia.  I personally reviewed and interpreted the films.  EKG is unchanged from previous and patient is without chest pain.  Patient given antipyretic and fluid bolus.  He reports he is feeling much better.  Influenza a is positive.  The patient understands that symptoms are greater than the recommended 24-48 hour window of treatment.  Patient will be discharged with instructions to orally hydrate, rest, and use over-the-counter medications such as anti-inflammatories ibuprofen and Aleve for muscle aches and Tylenol for fever.  Patient noted to have hypokalemia.  Will be discharged home with potassium supplement.  Patient will also be given a cough suppressant.   Final Clinical Impressions(s) / ED Diagnoses   Final diagnoses:  Flu-like symptoms  Fever, unspecified fever cause  Hypokalemia    ED Discharge Orders        Ordered    fluticasone (FLONASE) 50 MCG/ACT nasal spray  Daily,   Status:  Discontinued     08/16/17 0419    benzonatate (TESSALON) 100 MG capsule  Every 8 hours,   Status:  Discontinued     08/16/17 0419    potassium chloride SA (K-DUR,KLOR-CON) 20 MEQ tablet  Daily     08/16/17 0452    benzonatate (TESSALON) 100 MG capsule  Every 8 hours     08/16/17 0454    fluticasone (FLONASE) 50 MCG/ACT nasal spray  Daily     08/16/17 0454       Brittny Spangle, Dahlia Client, PA-C 08/16/17 0500    Glynn Octave, MD 08/16/17 [id number]

## 2017-08-16 NOTE — ED Notes (Signed)
ED Provider at bedside. 

## 2017-08-16 NOTE — Discharge Instructions (Signed)
1. Medications: flonase, mucinex, tessalon, Albuterol, usual home medications 2. Treatment: rest, drink plenty of fluids, take tylenol or ibuprofen for fever control 3. Follow Up: Please followup with your primary doctor in 3 days for discussion of your diagnoses and further evaluation after today's visit; if you do not have a primary care doctor use the resource guide provided to find one; Return to the ER for high fevers, difficulty breathing or other concerning symptoms

## 2017-08-26 ENCOUNTER — Other Ambulatory Visit: Payer: Self-pay

## 2017-08-26 ENCOUNTER — Emergency Department (HOSPITAL_COMMUNITY)
Admission: EM | Admit: 2017-08-26 | Discharge: 2017-08-26 | Disposition: A | Discharge status: Home / Self Care | Payer: Self-pay | Attending: Emergency Medicine | Admitting: Emergency Medicine

## 2017-08-26 DIAGNOSIS — R112 Nausea with vomiting, unspecified: Secondary | ICD-10-CM | POA: Insufficient documentation

## 2017-08-26 DIAGNOSIS — R1115 Cyclical vomiting syndrome unrelated to migraine: Secondary | ICD-10-CM

## 2017-08-26 DIAGNOSIS — R55 Syncope and collapse: Secondary | ICD-10-CM | POA: Insufficient documentation

## 2017-08-26 DIAGNOSIS — I10 Essential (primary) hypertension: Secondary | ICD-10-CM | POA: Insufficient documentation

## 2017-08-26 MED ORDER — ONDANSETRON 4 MG PO TBDP: ORAL_TABLET | ORAL | 0 refills | Status: AC

## 2017-08-26 NOTE — ED Notes (Signed)
ED Provider at bedside. 

## 2017-08-26 NOTE — ED Provider Notes (Signed)
[facility] EMERGENCY DEPARTMENT Provider Note   CSN: [id number] Arrival date & time: 08/26/17  0029     History   Chief Complaint Chief Complaint  Patient presents with  . Dizziness    HPI Ronald Marks is a 27 y.o. male.  The history is provided by the patient.  Dizziness  Quality:  Lightheadedness Severity:  Moderate Onset quality:  Gradual Timing:  Constant Progression:  Resolved Chronicity:  New Context comment:  Vomiting Relieved by:  Fluids Worsened by:  Movement Associated symptoms: diarrhea and nausea   Associated symptoms: no blood in stool, no chest pain, no shortness of breath and no syncope   pt reports onset of lightheadedness earlier, then soon after he had nausea/vomiting He also reports episodes of diarrhea earlier in the day No cp/sob No fever Denies syncope  He reports recent flu like illness that has not completely resolved  He reports after EMS arrived he received IV fluids, he started feeling improved.   Past Medical History:  Diagnosis Date  . Hypertension     There are no active problems to display for this patient.   No past surgical history on file.     Home Medications    Prior to Admission medications   Medication Sig Start Date End Date Taking? Authorizing Provider  amLODipine (NORVASC) 5 MG tablet Take 1 tablet (5 mg total) by mouth daily. Patient not taking: Reported on 08/16/2017 12/09/16   Doug Sou, MD  benzonatate (TESSALON) 100 MG capsule Take 1 capsule (100 mg total) by mouth every 8 (eight) hours. Patient not taking: Reported on 08/26/2017 08/16/17   Muthersbaugh, Dahlia Client, PA-C  fluticasone [facility]) 50 MCG/ACT nasal spray Place 2 sprays into both nostrils daily. Patient not taking: Reported on 08/26/2017 08/16/17   Muthersbaugh, Dahlia Client, PA-C  metoCLOPramide (REGLAN) 10 MG tablet Take 1 tablet (10 mg total) by mouth every 6 (six) hours as needed for nausea (nausea/headache). Patient not taking:  Reported on 08/16/2017 12/09/16   Doug Sou, MD  potassium chloride SA (K-DUR,KLOR-CON) 20 MEQ tablet Take 1 tablet (20 mEq total) by mouth daily. Patient not taking: Reported on 08/26/2017 08/16/17   Muthersbaugh, Dahlia Client, PA-C    Family History No family history on file.  Social History Social History   Tobacco Use  . Smoking status: Never Smoker  . Smokeless tobacco: Never Used  Substance Use Topics  . Alcohol use: No  . Drug use: No     Allergies   Lactose intolerance (gi)   Review of Systems Review of Systems  Respiratory: Negative for shortness of breath.   Cardiovascular: Negative for chest pain and syncope.  Gastrointestinal: Positive for diarrhea and nausea. Negative for blood in stool.  Neurological: Positive for dizziness and light-headedness.  All other systems reviewed and are negative.    Physical Exam Updated Vital Signs BP 129/85 (BP Location: Right Arm)   Pulse 72   Temp 98.3 F (36.8 C) (Oral)   Resp 17   Ht 1.803 m (5\' 11" )   Wt 136.1 kg (300 lb)   SpO2 98%   BMI 41.84 kg/m   Physical Exam  CONSTITUTIONAL: Well developed/well nourished HEAD: Normocephalic/atraumatic EYES: EOMI/PERRL ENMT: Mucous membranes moist NECK: supple no meningeal signs SPINE/BACK:entire spine nontender CV: S1/S2 noted, no murmurs/rubs/gallops noted LUNGS: Lungs are clear to auscultation bilaterally, no apparent distress ABDOMEN: soft, nontender, no rebound or guarding, bowel sounds noted throughout abdomen GU:no cva tenderness NEURO: Pt is awake/alert/appropriate, moves all extremitiesx4.  No facial droop.  EXTREMITIES: pulses normal/equal, full ROM SKIN: warm, color normal PSYCH: no abnormalities of mood noted, alert and oriented to situation  ED Treatments / Results  Labs (all labs ordered are listed, but only abnormal results are displayed) Labs Reviewed - No data to display  EKG ED ECG REPORT   Date: 08/26/2017 0128am  Rate: 68   Rhythm: normal  sinus rhythm  QRS Axis: normal  Intervals: normal  ST/T Wave abnormalities: normal  Conduction Disutrbances:incomplete RBBB  Narrative Interpretation:   Old EKG Reviewed: unchanged  I have personally reviewed the EKG tracing and agree with the computerized printout as noted.  Radiology No results found.  Procedures Procedures (including critical care time)  Medications Ordered in ED Medications - No data to display   Initial Impression / Assessment and Plan / ED Course  I have reviewed the triage vital signs and the nursing notes.      Presents with onset of nausea vomiting and near syncope.  He also reports recent diarrhea.  He is now feeling improved with IV fluids.  He did not fully pass out.  No chest pain or shortness of breath.  He is awake alert, no distress He was diagnosed with flu earlier in the month. His EKG is unchanged. Suspect his near syncope was related to his underlying vomiting. 1:57 AM Back to baseline, no distress noted.  Will discharge home.  He does admit to nausea vomiting diarrhea.  Will give short course of Zofran  Final Clinical Impressions(s) / ED Diagnoses   Final diagnoses:  Non-intractable cyclical vomiting with nausea  Near syncope    ED Discharge Orders        Ordered    ondansetron (ZOFRAN ODT) 4 MG disintegrating tablet     08/26/17 0156       Zadie RhineWickline, Shaunice Levitan, MD 08/26/17 0157

## 2017-08-26 NOTE — ED Triage Notes (Signed)
Patient c/o dizziness and was scared he was going to pass out. Seen here last week for same (syncope) and was dx with flu. States still n/v and chills. EMS gave 4mg  Zofran IVP

## 2017-08-26 NOTE — ED Notes (Signed)
Orthostatics  Lying-  116/75  HR68  Sitting-  117/84  HR71  Standing-  124/87  HR66

## 2017-09-30 ENCOUNTER — Emergency Department (HOSPITAL_COMMUNITY)
Admission: EM | Admit: 2017-09-30 | Discharge: 2017-09-30 | Disposition: A | Discharge status: Home / Self Care | Payer: Self-pay | Attending: Emergency Medicine | Admitting: Emergency Medicine

## 2017-09-30 ENCOUNTER — Encounter (HOSPITAL_COMMUNITY): Payer: Self-pay | Admitting: Emergency Medicine

## 2017-09-30 DIAGNOSIS — Z5321 Procedure and treatment not carried out due to patient leaving prior to being seen by health care provider: Secondary | ICD-10-CM | POA: Insufficient documentation

## 2017-09-30 DIAGNOSIS — R51 Headache: Secondary | ICD-10-CM | POA: Insufficient documentation

## 2017-09-30 MED ORDER — ONDANSETRON 4 MG PO TBDP
4.0000 mg | ORAL_TABLET | Freq: Once | ORAL | Status: AC
  Administered 2017-09-30: 4 mg via ORAL
  Filled 2017-09-30: qty 1

## 2017-09-30 MED ORDER — OXYCODONE-ACETAMINOPHEN 5-325 MG PO TABS
1.0000 | ORAL_TABLET | Freq: Once | ORAL | Status: AC
  Administered 2017-09-30: 1 via ORAL
  Filled 2017-09-30: qty 1

## 2017-09-30 MED ORDER — ONDANSETRON HCL 4 MG/2ML IJ SOLN: 4.0000 mg | Freq: Once | INTRAMUSCULAR | Status: DC

## 2017-09-30 NOTE — ED Notes (Signed)
Called pt multiple times with no answer.  

## 2017-09-30 NOTE — ED Triage Notes (Signed)
Patient reports migraine headache with emesis and photophobia onset this morning .

## 2018-05-04 IMAGING — CR DG CHEST 2V
2 series · 2 of 2 positions shown · non-contrast
Comparison: None.

CLINICAL DATA: Acute onset of vomiting and diarrhea. Cough.
Generalized weakness. Fever.

EXAM:
CHEST  2 VIEW

[chest pa]
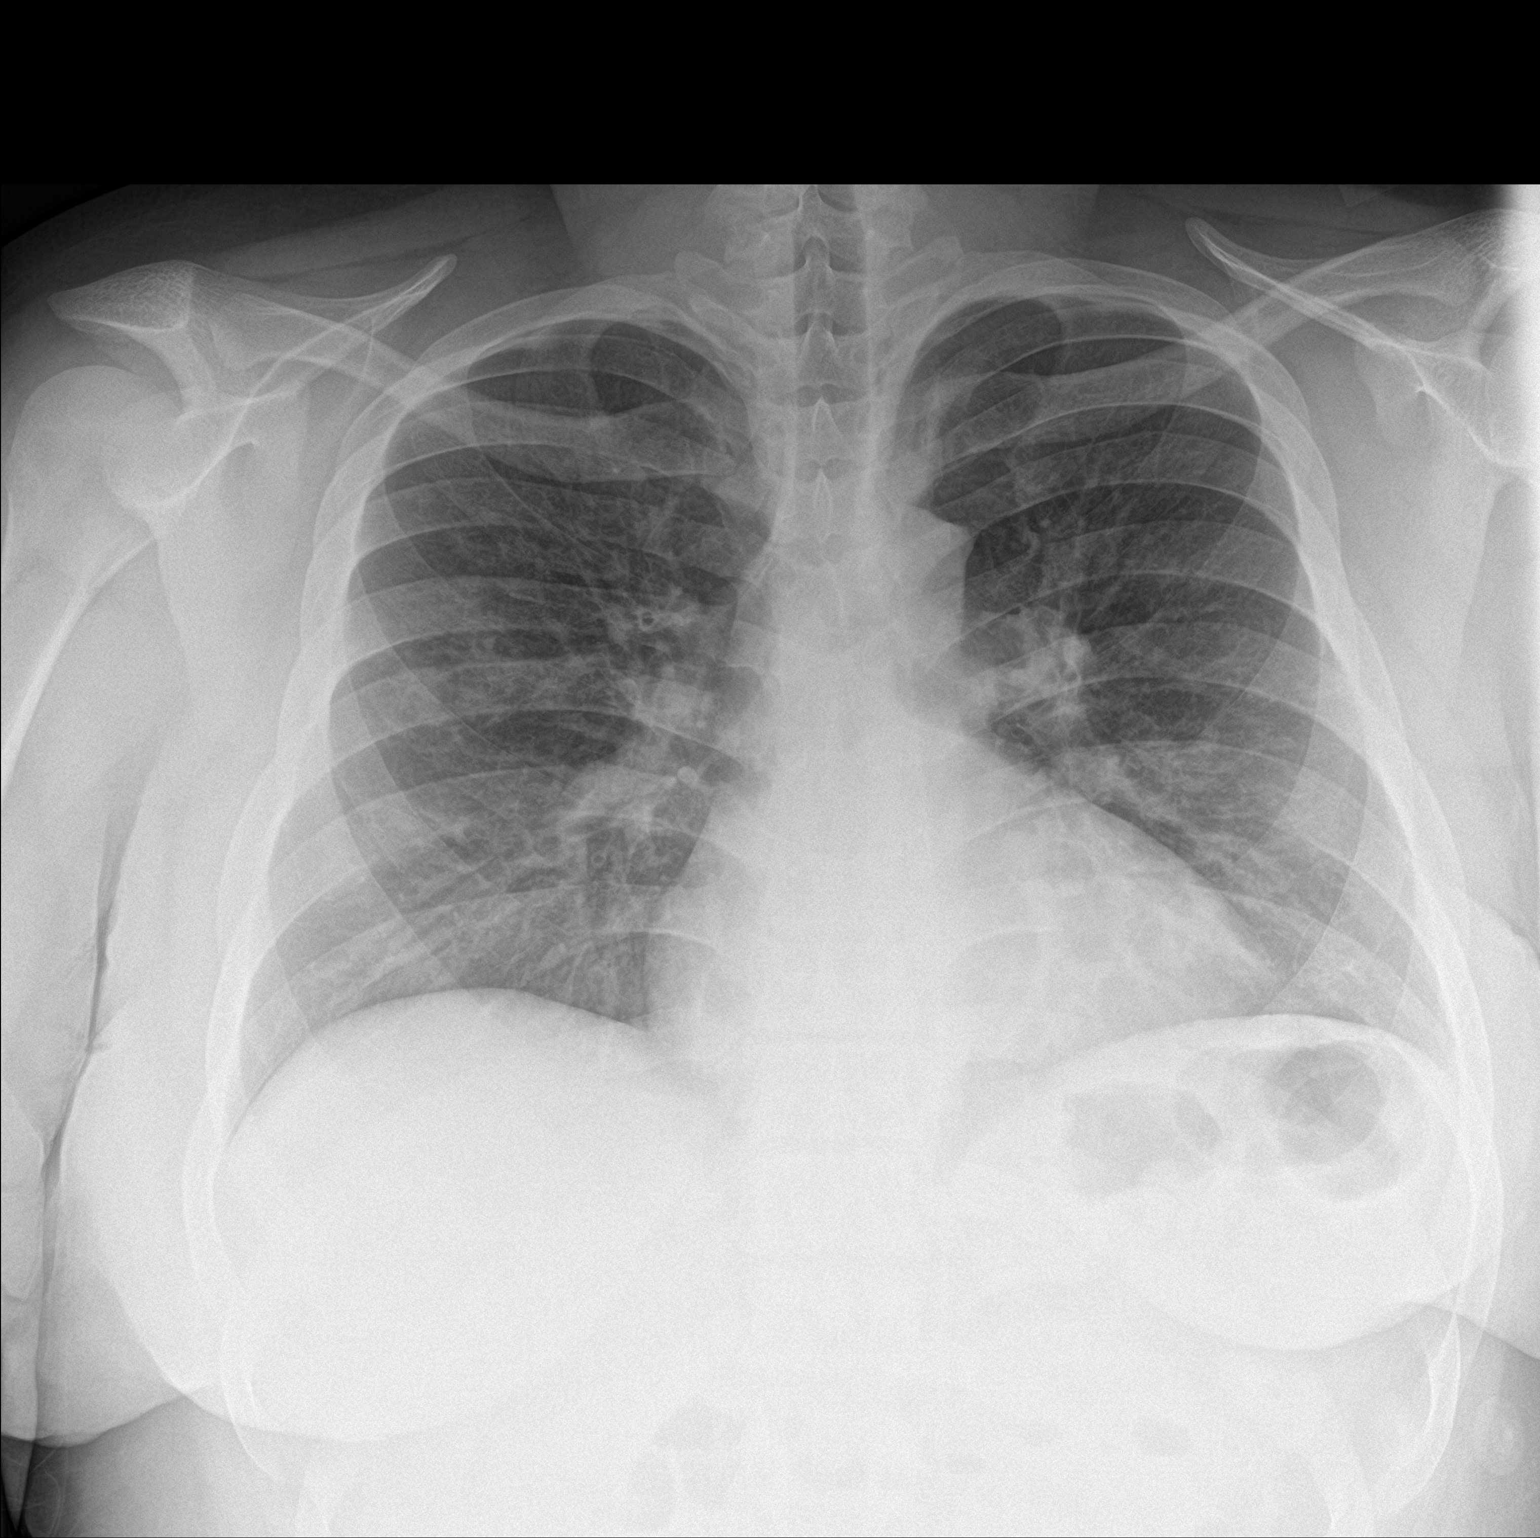

[chest lat]
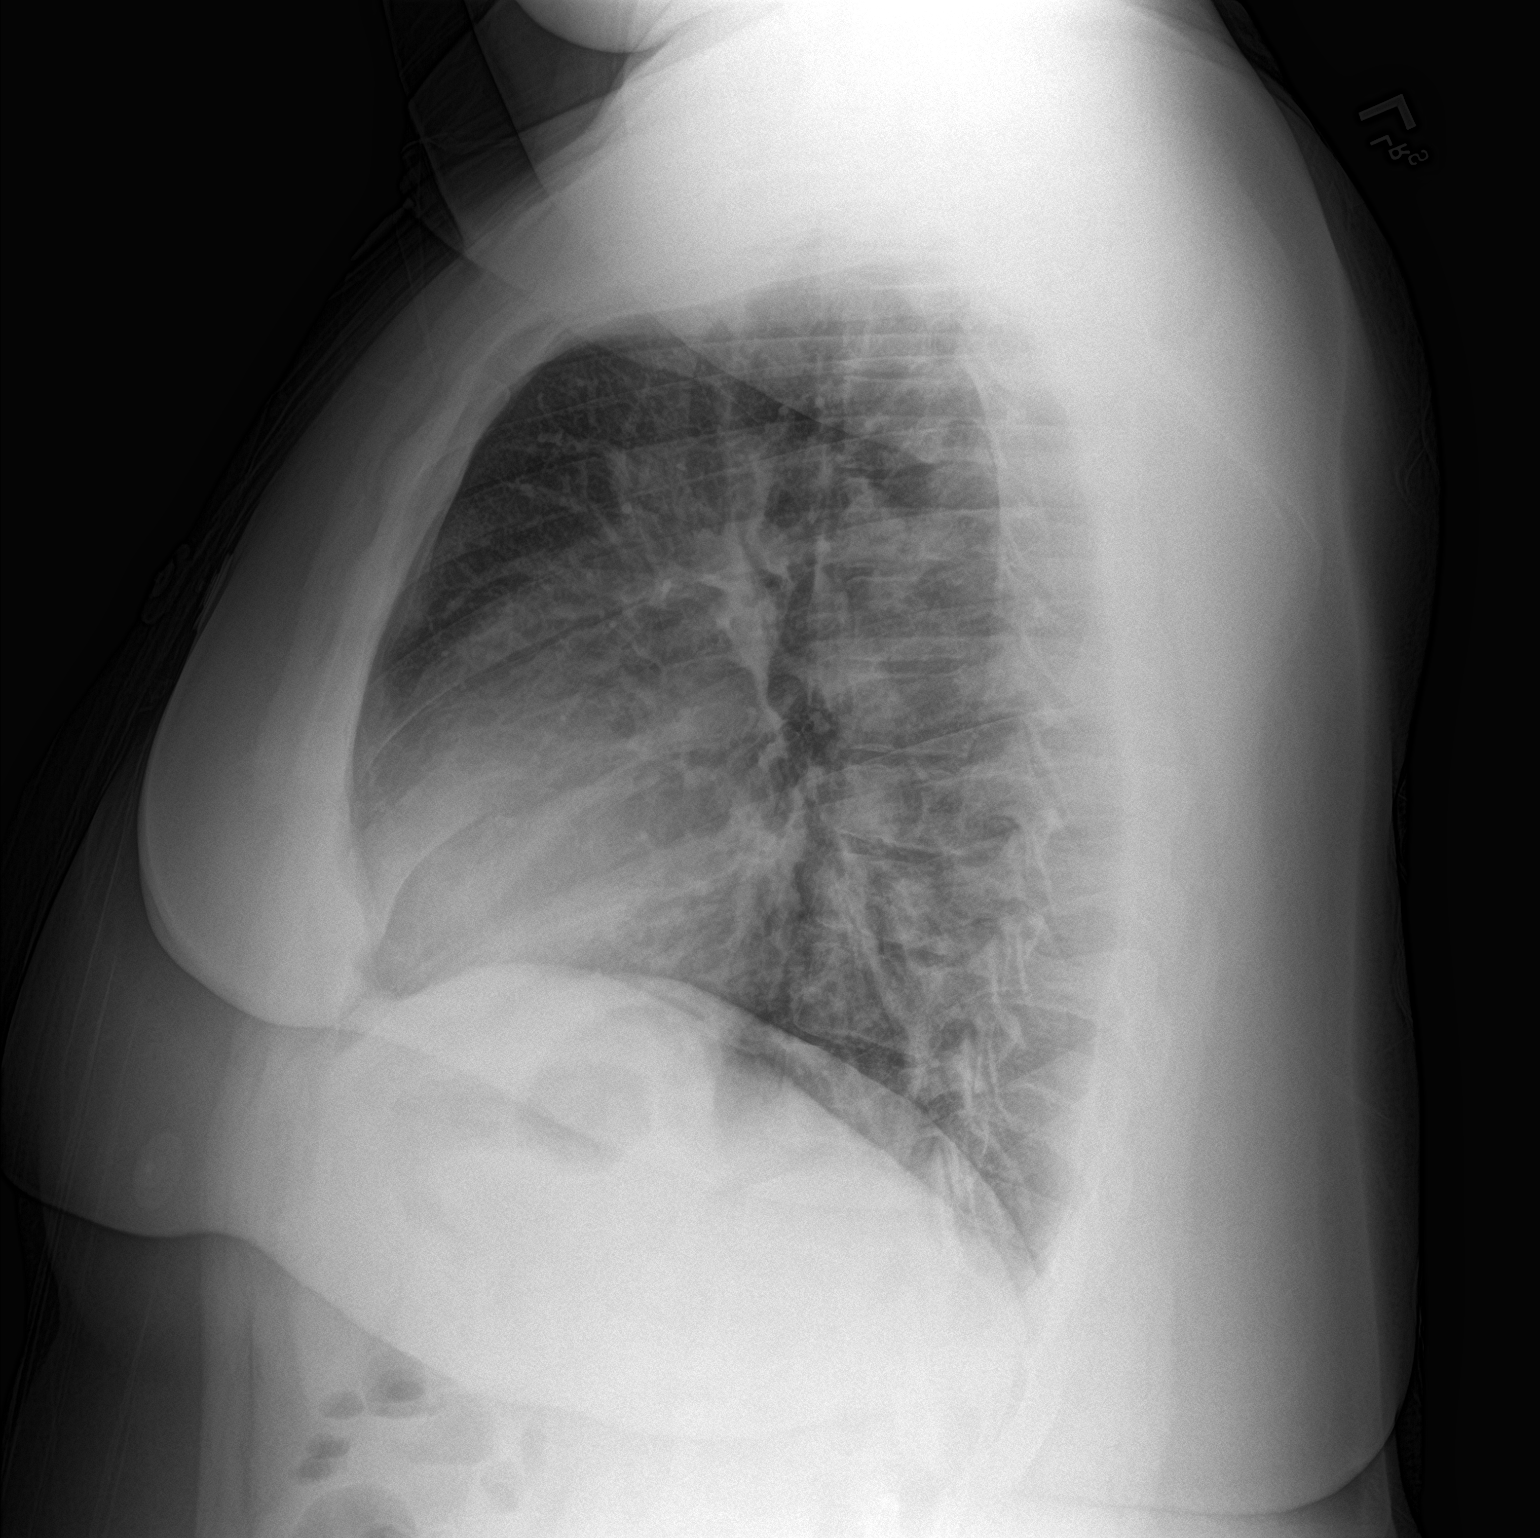

[2 of 2 positions shown; findings below may reference images not displayed]

FINDINGS: The lungs are well-aerated. Mild peribronchial thickening is noted.
There is no evidence of focal opacification, pleural effusion or
pneumothorax.

The heart is normal in size; the mediastinal contour is within
normal limits. No acute osseous abnormalities are seen.
IMPRESSION: Mild peribronchial thickening noted.  Lungs otherwise grossly clear.
